# Patient Record
Sex: Female | Born: 1955 | Race: Black or African American | Hispanic: No | Marital: Married | Smoking: Never smoker
Health system: Southern US, Community
[De-identification: ages and names within clinical notes are randomized; demographics above are authoritative.]

---

## 2001-09-20 ENCOUNTER — Ambulatory Visit (HOSPITAL_COMMUNITY): Admission: RE | Admit: 2001-09-20 | Discharge: 2001-09-20 | Payer: Self-pay | Admitting: Gastroenterology

## 2001-09-28 ENCOUNTER — Encounter: Admission: RE | Admit: 2001-09-28 | Discharge: 2001-09-28 | Payer: Self-pay | Admitting: Gastroenterology

## 2001-09-28 ENCOUNTER — Encounter: Payer: Self-pay | Admitting: Gastroenterology

## 2004-08-31 ENCOUNTER — Ambulatory Visit (HOSPITAL_COMMUNITY): Admission: RE | Admit: 2004-08-31 | Discharge: 2004-08-31 | Payer: Self-pay | Admitting: Obstetrics

## 2012-12-08 ENCOUNTER — Other Ambulatory Visit (HOSPITAL_COMMUNITY): Payer: Self-pay | Admitting: Family Medicine

## 2012-12-08 DIAGNOSIS — Z1231 Encounter for screening mammogram for malignant neoplasm of breast: Secondary | ICD-10-CM

## 2012-12-20 ENCOUNTER — Ambulatory Visit (HOSPITAL_COMMUNITY)
Admission: RE | Admit: 2012-12-20 | Discharge: 2012-12-20 | Disposition: A | Discharge status: Home / Self Care | Source: Ambulatory Visit | Attending: Family Medicine | Admitting: Family Medicine

## 2012-12-20 DIAGNOSIS — Z1231 Encounter for screening mammogram for malignant neoplasm of breast: Secondary | ICD-10-CM

## 2012-12-28 ENCOUNTER — Other Ambulatory Visit: Payer: Self-pay | Admitting: Family Medicine

## 2012-12-28 DIAGNOSIS — R928 Other abnormal and inconclusive findings on diagnostic imaging of breast: Secondary | ICD-10-CM

## 2013-01-17 ENCOUNTER — Ambulatory Visit
Admission: RE | Admit: 2013-01-17 | Discharge: 2013-01-17 | Disposition: A | Discharge status: Home / Self Care | Source: Ambulatory Visit | Attending: Family Medicine | Admitting: Family Medicine

## 2013-01-17 DIAGNOSIS — R928 Other abnormal and inconclusive findings on diagnostic imaging of breast: Secondary | ICD-10-CM

## 2013-08-15 ENCOUNTER — Other Ambulatory Visit (HOSPITAL_COMMUNITY): Payer: Self-pay | Admitting: Family Medicine

## 2013-08-15 DIAGNOSIS — R109 Unspecified abdominal pain: Secondary | ICD-10-CM

## 2013-08-20 ENCOUNTER — Ambulatory Visit (HOSPITAL_COMMUNITY)
Admission: RE | Admit: 2013-08-20 | Discharge: 2013-08-20 | Disposition: A | Discharge status: Home / Self Care | Source: Ambulatory Visit | Attending: Family Medicine | Admitting: Family Medicine

## 2013-08-20 DIAGNOSIS — K7689 Other specified diseases of liver: Secondary | ICD-10-CM | POA: Insufficient documentation

## 2013-08-20 DIAGNOSIS — R109 Unspecified abdominal pain: Secondary | ICD-10-CM | POA: Insufficient documentation

## 2014-04-11 ENCOUNTER — Other Ambulatory Visit (HOSPITAL_COMMUNITY): Payer: Self-pay | Admitting: Family Medicine

## 2014-04-11 DIAGNOSIS — Z1231 Encounter for screening mammogram for malignant neoplasm of breast: Secondary | ICD-10-CM

## 2014-05-02 ENCOUNTER — Ambulatory Visit (HOSPITAL_COMMUNITY)

## 2014-05-08 ENCOUNTER — Ambulatory Visit (HOSPITAL_COMMUNITY)
Admission: RE | Admit: 2014-05-08 | Discharge: 2014-05-08 | Disposition: A | Discharge status: Home / Self Care | Source: Ambulatory Visit | Attending: Family Medicine | Admitting: Family Medicine

## 2014-05-08 DIAGNOSIS — Z1231 Encounter for screening mammogram for malignant neoplasm of breast: Secondary | ICD-10-CM

## 2016-07-13 IMAGING — MG MM DIGITAL SCREENING BILAT
6 series · 6 of 6 positions shown · non-contrast
Comparison: Previous exam(s).

CLINICAL DATA: Screening. Strong family history of breast cancer
diagnosed in sisters at ages 50 and 55.

EXAM:
DIGITAL SCREENING BILATERAL MAMMOGRAM WITH CAD

[L CC]
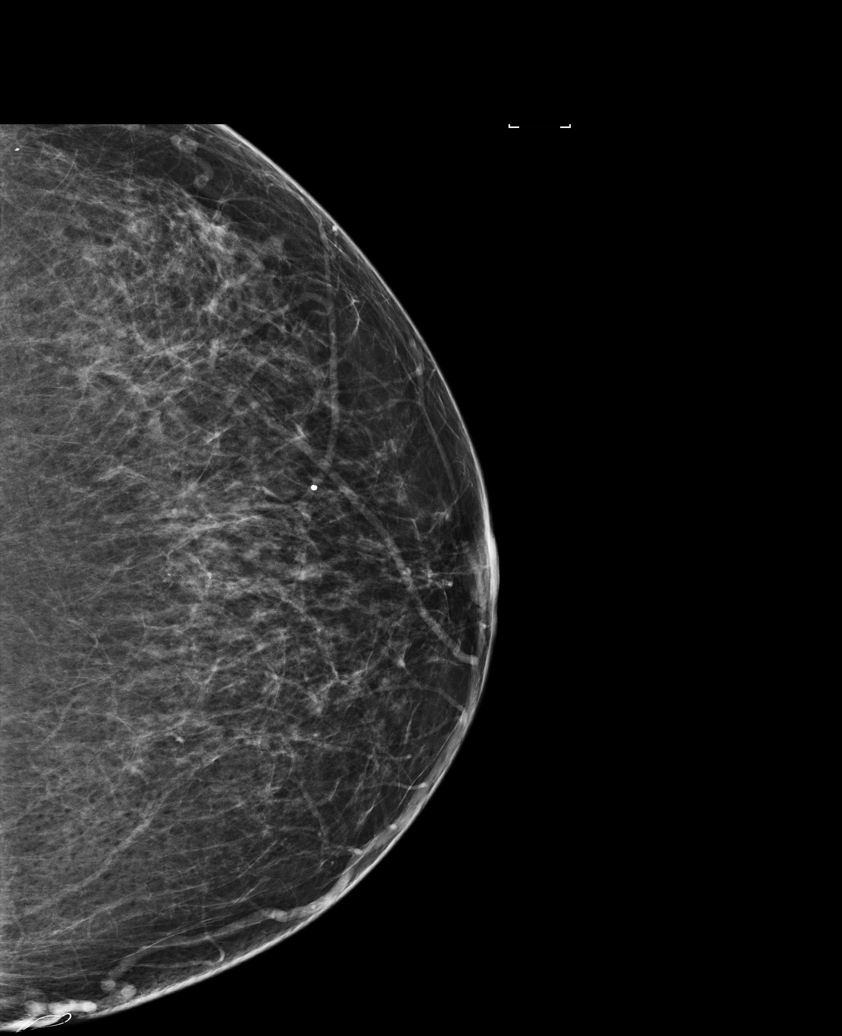

[R MLO (1 of 2)]
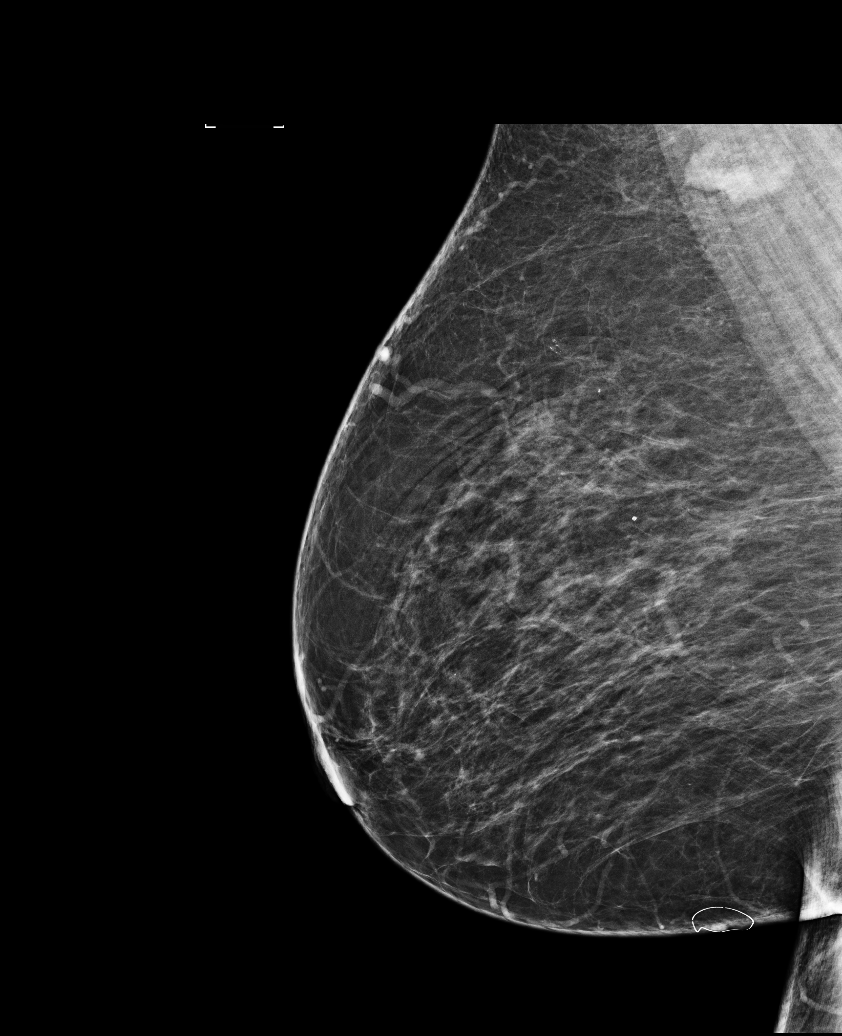

[R MLO (2 of 2)]
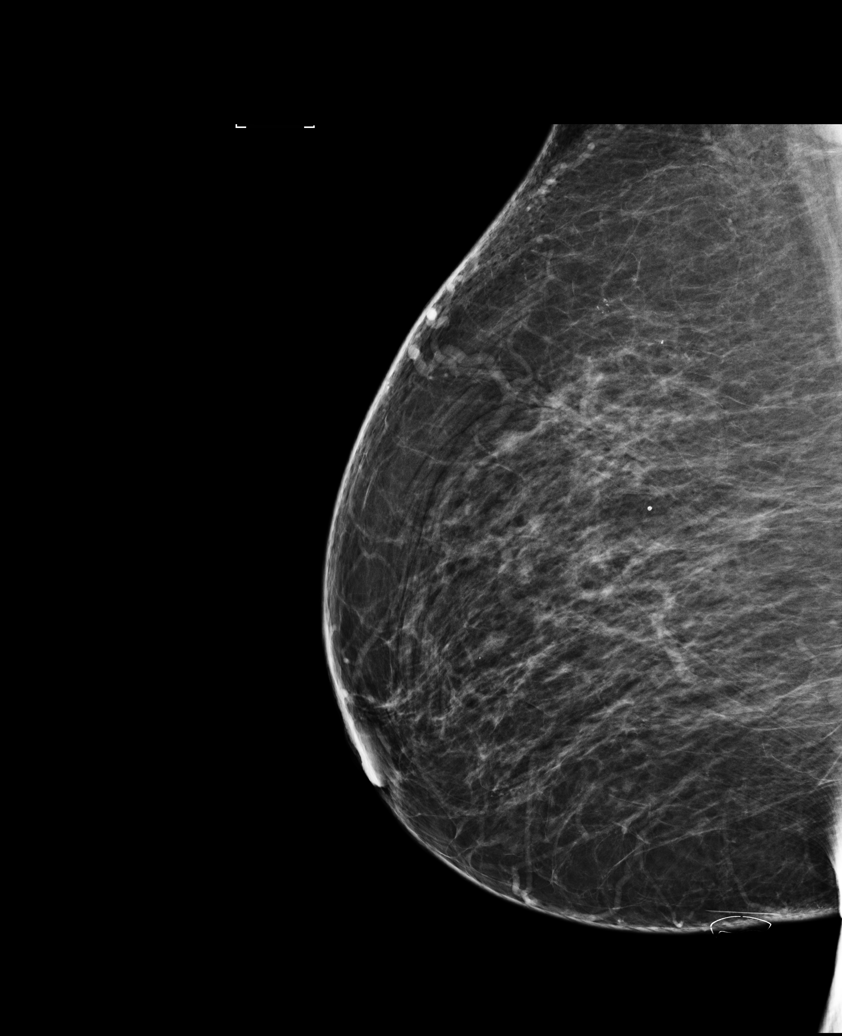

[R CC]
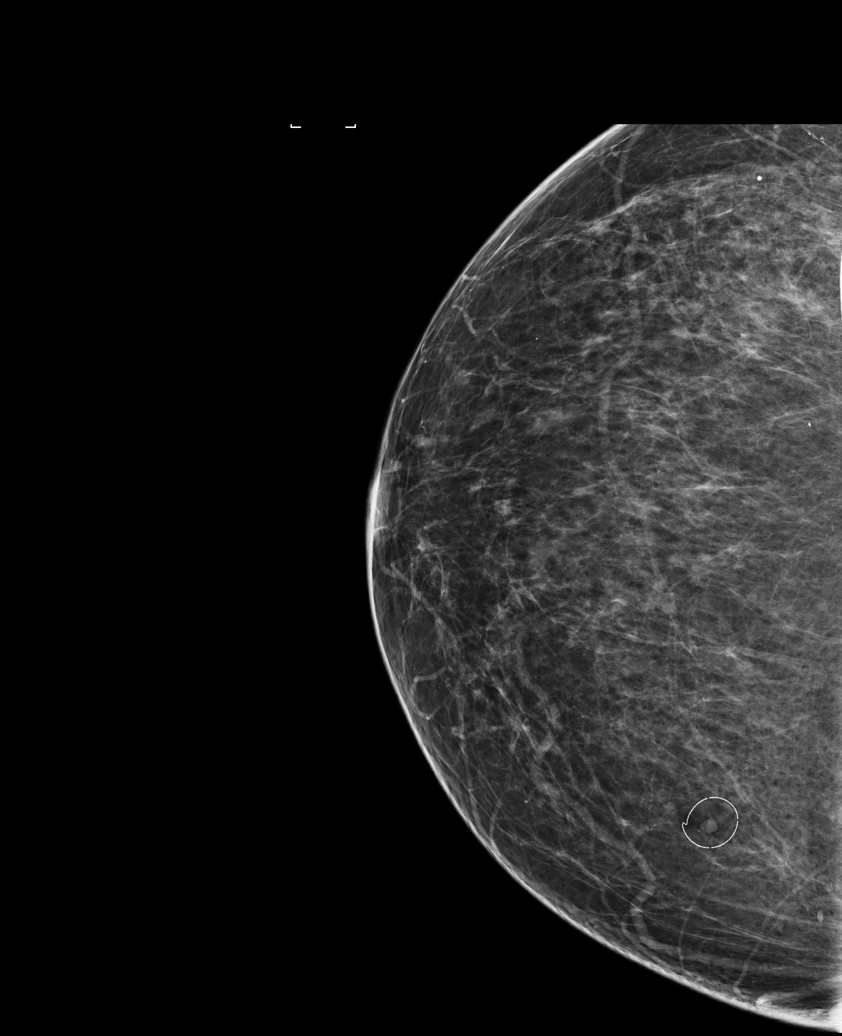

[L MLO (1 of 2)]
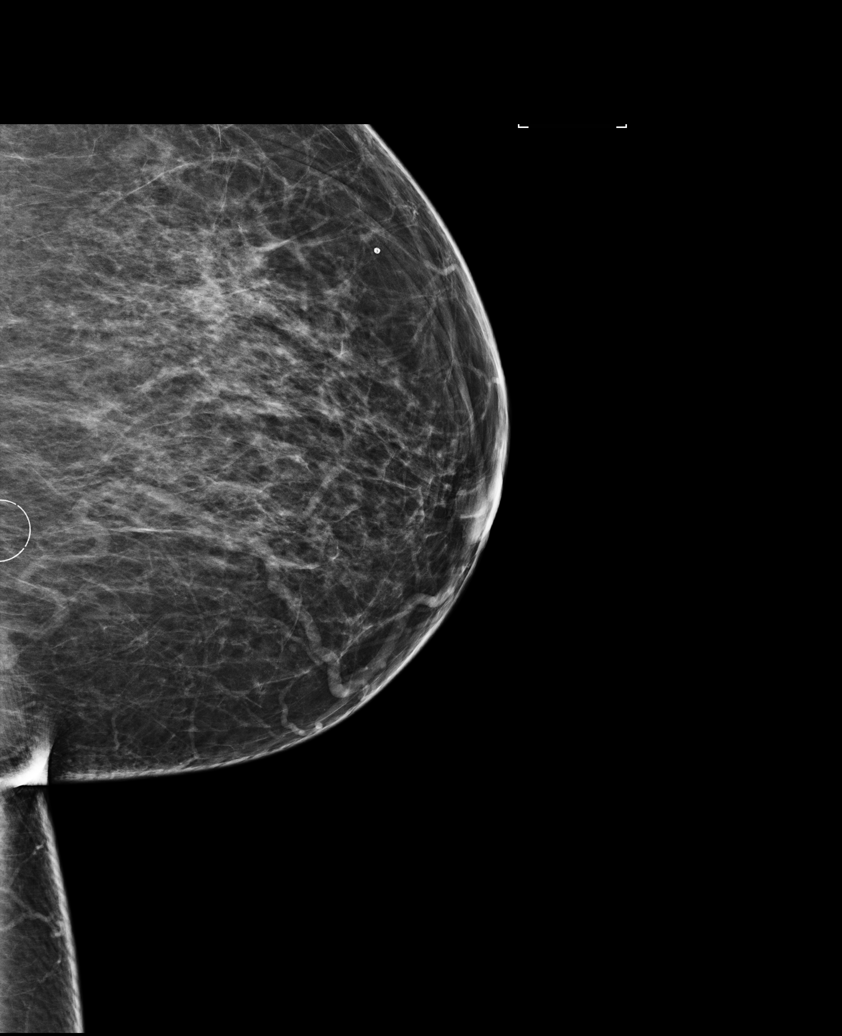

[L MLO (2 of 2)]
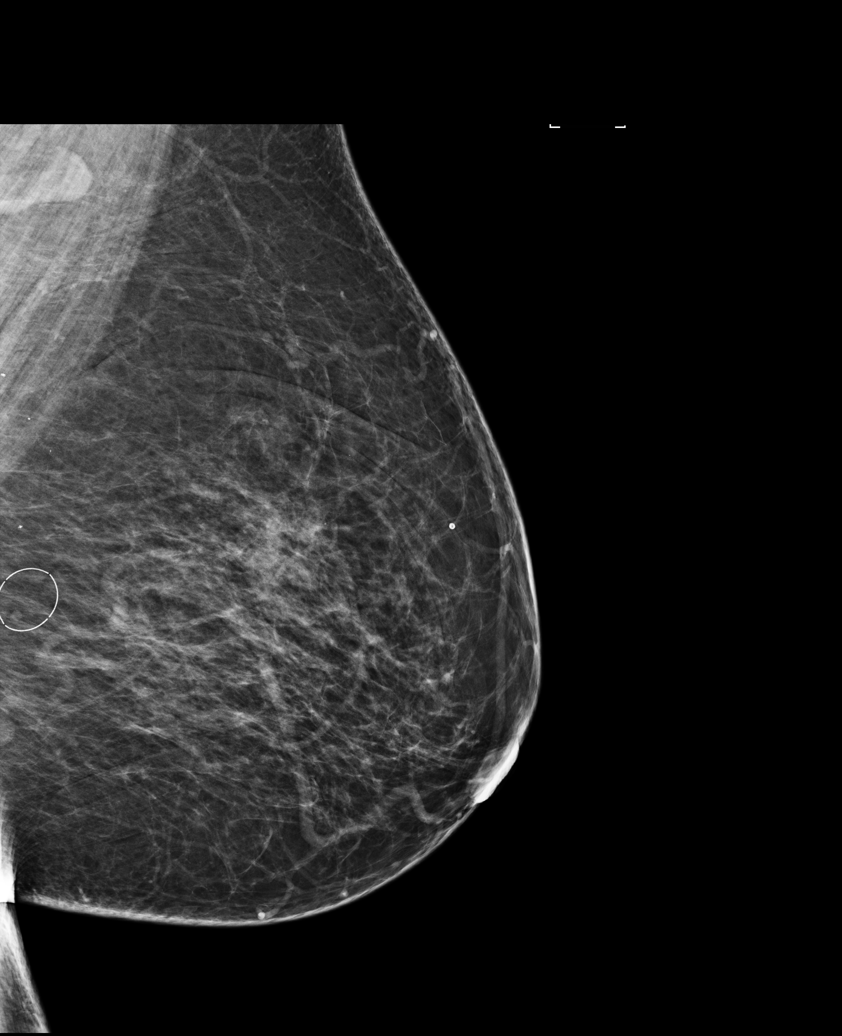

[6 of 6 positions shown; findings below may reference images not displayed]

ACR Breast Density Category b: There are scattered areas of
fibroglandular density.
FINDINGS: There are no findings suspicious for malignancy. Images were
processed with CAD.
IMPRESSION: 1. No mammographic evidence of malignancy.
2. Based on the Tyrer-Cuzick risk assessment model, the patient has
a 22% lifetime risk of developing breast cancer. By the
recommendations of the American Cancer Society, annual MRI and
annual mammography are recommended.
3. A result letter of this screening mammogram will be mailed
directly to the patient.

RECOMMENDATION:
1. Screening mammogram in one year. (Code:[id number])
2. Annual MRI is recommended in addition to mammography.

BI-RADS CATEGORY  1: Negative.

## 2022-06-23 ENCOUNTER — Encounter: Payer: Self-pay | Admitting: Nurse Practitioner

## 2022-06-23 ENCOUNTER — Ambulatory Visit (INDEPENDENT_AMBULATORY_CARE_PROVIDER_SITE_OTHER): Admitting: Nurse Practitioner

## 2022-06-23 VITALS — BP 126/76 | HR 66 | Ht 65.0 in | Wt 281.0 lb

## 2022-06-23 DIAGNOSIS — R142 Eructation: Secondary | ICD-10-CM | POA: Diagnosis not present

## 2022-06-23 DIAGNOSIS — R194 Change in bowel habit: Secondary | ICD-10-CM | POA: Diagnosis not present

## 2022-06-23 DIAGNOSIS — Z8619 Personal history of other infectious and parasitic diseases: Secondary | ICD-10-CM | POA: Diagnosis not present

## 2022-06-23 MED ORDER — BENEFIBER PO POWD: ORAL | 0 refills | Status: AC

## 2022-06-23 MED ORDER — RIFAXIMIN 550 MG PO TABS: 550.0000 mg | ORAL_TABLET | Freq: Three times a day (TID) | ORAL | 0 refills | Status: AC

## 2022-06-23 NOTE — Progress Notes (Addendum)
Chief Complaint:  diarrhea and constipation, sour belches   Assessment & Plan   # 66 yo female with "sour" belches and chronically altered bowel habits with loose stool alternating with " constipation".  Describes several days of loose stools followed by no BM for two days then passage of a hard stool. After hard BM the diarrhea returns and cycles repeats itself. The "constipation" phase is short lived and probably just due to absence of stool in colon after days of diarrhea. Etiology unclear but sounds like she may have diarrhea predominant IBS or SIBO possible. EPI less likely.  Previously evaluated at [facility] with reported negative EGD / colonoscopy.  Will request reports  Trial of Benefiber Treat empirically for SIBO. Will try Xifaxan. If not covered then will use a different regimen.  Follow up in 4 weeks. If not improving consider checking fecal elastase.   # History of H. Pylori in 2020 at [facility]. Reportedly treated but follow up testing no carried out.  Check for H.pylori eradication. Obtain H.pylori stool ag ( Diatherix so stopping PPI not needed).   # GERD?  Takes Omeprazole but she really doesn't know why. No GERD symptoms Might try changing to H2 blocker in future, or even try off reflux medications.   # Colon cancer screening.   Will review colonoscopy report when received from [place] and decide from there when her next screening colonoscopy needs to be done  # PAF on Eliquis  # Hypothyroidism   ADDENDUM 08/16/21 Records from [facility] Dr. Rolm Bookbinder 06/08/19  EGD and colonoscopy  for evaluation of weight loss, nausea and vomiting, diarrhea.  Normal esophagus.  GE junction and Z-line were at 40 cm.  Normal mucosa in the whole stomach.  Multiple biopsies taken.  Normal mucosa in the duodenum.  Multiple biopsies taken.  Colonoscopy-good prep.  Complete exam.  Normal mucosa in the whole colon.  Multiple biopsies taken to assess for microscopic  colitis.  Medium internal hemorrhoids.   HPI:    Destiny Miranda is a 66 y.o. year old female new to the practice with a past medical history of PVD, DM2, morbid obesity , H.pylori infection, hypothyroidism, PAF on Eliquis.  See PMH / [place] for additional history  Patient seen at [facility] in 2020.   RUQ Oct 2020 >> unrevealing except for stable presumed hemangioma in right hepatic lobe.   She  underwent EGD / colonoscopy in 2020. Reports cannot be seen in Care Everywhere but per follow up office note on 01/29/20 findings notable "HP and int hemorrhoids, but negative for celiac and microscopic colitis. Repeat colon recommended in 11/30". Recommendations were for  fiber supplement, check H.pylori stool ag ( not done),  check gastric emptying study ( not done).  She tried Metamucil but cannot really remember if it helped.   She continues to altered bowel habits, sour belches.  She may have 2-3 loose non-bloody BMs a day. Then no BM for a couple of days then has a hard stool requiring her to strain. The diarrhea will start back, sometimes in the same day. She cannot correlate bowel habits to anything she eats. Her weight is stable.    No [place] of colon cancer.   Previous GI Evaluation   EGD / colonoscopy 2020 at [facility]:  Gallbladder: No gallstones or wall thickening visualized. No sonographic Murphy sign noted by sonographer.   Common bile duct:  Diameter: 2 mm  Liver:  Focal, echogenic lesion in the right hepatic lobe measuring 11 x 10  x 11 mm is stable based on comparison with exam from 2015. No  additional lesion is visualized. Portal vein is patent on color  Doppler imaging with normal direction of blood flow towards the  liver.  Other: None.  IMPRESSION:  1. No signs of acute cholecystitis or biliary ductal dilation.  2. 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   Past Medical History:  Obesity, DM2, atrial fibrillation,  hypothyroidism, thyroid nodules, H. Pylori infection,   History reviewed. No pertinent surgical history. Family History  Problem Relation Age of Onset   Diabetes Father    Hypertension Father    Diabetes Sister    Hypertension Sister    Diabetes Brother    Hypertension Brother    Colon cancer Neg Hx    Stomach cancer Neg Hx    Esophageal cancer Neg Hx    Social History   Tobacco Use   Smoking status: Never   Smokeless tobacco: Never  Vaping Use   Vaping Use: Never used   Current Outpatient Medications  Medication Sig Dispense Refill   apixaban (ELIQUIS) 5 MG TABS tablet Take 5 mg by mouth 2 (two) times daily.     atorvastatin (LIPITOR) 80 MG tablet Take 80 mg by mouth daily.     furosemide (LASIX) 40 MG tablet Furosemide     gabapentin (NEURONTIN) 300 MG capsule Take by mouth.     insulin glargine (LANTUS) 100 UNIT/ML injection Lantus     lisinopril-hydrochlorothiazide (ZESTORETIC) 20-25 MG tablet Take 1 tablet by mouth daily.     NON FORMULARY Freestyle libre 14 day sensor kit     omeprazole (PRILOSEC) 20 MG capsule      pentoxifylline (TRENTAL) 400 MG CR tablet Take by mouth.     terbinafine (LAMISIL) 250 MG tablet      triamcinolone cream (KENALOG) 0.1 % Apply topically.     TRULICITY 3 EL/0.7AJ SOPN Inject into the skin.     pentoxifylline (TRENTAL) 400 MG CR tablet Pentoxifylline (Patient not taking: Reported on 06/23/2022)     No current facility-administered medications for this visit.   Not on File   Review of Systems: All other systems reviewed and negative except where noted in HPI.   Wt Readings from Last 3 Encounters:  06/23/22 281 lb (127.5 kg)    Physical Exam   BP 126/76   Pulse 66   Ht _0  (1.651 m)   Wt 281 lb (127.5 kg)   BMI 46.76 kg/m  Constitutional:  Pleasant obese female in no acute distress. Psychiatric: Pleasant. Normal mood and affect. Behavior is normal. EENT: Pupils normal.  Conjunctivae are normal. No scleral icterus. Neck  supple.  Cardiovascular: Normal rate, regular rhythm. No edema Pulmonary/chest: Effort normal and breath sounds normal. No wheezing, rales or rhonchi. Abdominal: Soft, nondistended, nontender. Bowel sounds active throughout. There are no masses palpable. No hepatomegaly. Neurological: Alert and oriented to person place and time. Skin: Skin is warm and dry. No rashes noted.  Tye Savoy, NP  06/23/2022, 11:37 AM  Cc:  Referring Provider Rupert Stacks, MD

## 2022-06-23 NOTE — Patient Instructions (Addendum)
Your provider has ordered "Diatherix" stool testing for you. You have received a kit from our office today containing all necessary supplies to complete this test. Please carefully read the stool collection instructions provided in the kit before opening the accompanying materials. In addition, be sure to place the label from the top left corner of the laboratory request sheet onto the "puritan opti-swab" tube that is supplied in the kit. This label should include your full name and date of birth. After completing the test, you should secure the purtian tube into the specimen biohazard bag. The laboratory request information sheet (including date and time of specimen collection) should be placed into the outside pocket of the specimen biohazard bag and returned to the [facility] lab with 2 days of collection.    We have sent the following medications to your pharmacy for you to pick up at your convenience: Benefiber and Xifaxan  I have made you a follow up appointment with Tye Savoy RNP on 12/13 1:30

## 2022-06-24 NOTE — Progress Notes (Signed)
Attending Physician's Attestation   I have reviewed the chart.   I agree with the Advanced Practitioner's note, impression, and recommendations with any updates as below.    Hermenegildo Clausen Mansouraty, MD [facility] Office # [phone]  

## 2022-06-28 ENCOUNTER — Other Ambulatory Visit (HOSPITAL_COMMUNITY): Payer: Self-pay

## 2022-07-02 ENCOUNTER — Telehealth: Payer: Self-pay | Admitting: Pharmacy Technician

## 2022-07-02 ENCOUNTER — Other Ambulatory Visit (HOSPITAL_COMMUNITY): Payer: Self-pay

## 2022-07-02 NOTE — Telephone Encounter (Signed)
Patient Advocate Encounter  Received notification from [employer] that prior authorization for XIFAXAN 550MG  is required.   PA submitted on 11.24.23 Key [id number] Status is pending

## 2022-07-05 NOTE — Telephone Encounter (Signed)
Received a fax regarding Prior Authorization from [employer] for [facility] 550MG . Authorization has been DENIED because:

## 2022-07-12 NOTE — Telephone Encounter (Signed)
Left message on machine to call back  

## 2022-07-14 NOTE — Telephone Encounter (Signed)
Left message on machine to call back  

## 2022-07-15 ENCOUNTER — Telehealth: Payer: Self-pay

## 2022-07-15 NOTE — Telephone Encounter (Signed)
Records received. Placed in your office IN box for review.

## 2022-07-15 NOTE — Telephone Encounter (Signed)
Called [facility] and spoke with Raynelle Fanning in Medical Records. She will fax over results to our office today. Will await fax.

## 2022-07-15 NOTE — Telephone Encounter (Signed)
-----   Message from Meredith Pel, NP sent at 07/09/2022  4:45 PM EST ----- Destiny Miranda, next week can you please try and get a copy of patient's EGD and colonoscopy with any biopsy reports from [facility]  Thanks

## 2022-07-16 NOTE — Telephone Encounter (Signed)
Unable to leave voicemail on patient's home phone number.

## 2022-07-20 NOTE — Telephone Encounter (Signed)
Left message for pt to call back. Letter mailed to patient.

## 2022-07-21 ENCOUNTER — Ambulatory Visit: Admitting: Nurse Practitioner

## 2022-07-27 ENCOUNTER — Other Ambulatory Visit: Payer: Self-pay

## 2022-07-27 MED ORDER — METRONIDAZOLE 250 MG PO TABS: 250.0000 mg | ORAL_TABLET | Freq: Three times a day (TID) | ORAL | 0 refills | Status: AC

## 2022-07-27 NOTE — Telephone Encounter (Signed)
Spoke with pt. Let pt know Destiny Miranda's recommendations. Pt verbalized understanding and confirmed she does not have an allergy to flagyl. Flagyl sent to pt's pharmacy. Pt had no other concerns at end of call.

## 2022-08-18 NOTE — Progress Notes (Signed)
08/19/2022 Destiny Miranda [id number] Sep 14, 1955  Referring provider: Christ Kick, * Primary GI doctor: Dr. Rush Landmark  ASSESSMENT AND PLAN:   Altered bowel habits with bloating/beltching Better with flagyl, finish RX, if reoccurs can repeat RX Can do trial of IBGARD daily, will give Bentyl as needed in the mean time  Add on citracel/benefiber FODMAP, trial off lactulose and lifestyle changes discussed  Type 2 diabetes mellitus with other specified complication, with long-term current use of insulin (HCC) Loose stools not associated with food Consider fecal elastase in the future On trulicity x 1 year, ? contributing  History of H pylori Negative diatherix   History of Present Illness:  67 y.o. female  with a past medical history of PVD, DM2 x 30 years, morbid obesity, H pylori infection, hyperthyroidism, PA on Elqiuis and others listed below, returns to clinic today for evaluation of abnormal stools and beltching. Diatherix negative for H. Pylori 06/23/2022 office visit with Tye Savoy for belching, altered bowel habits history of H. pylori.   Had unremarkable EGD colonoscopy at [facility], treated empirically for SIBO Xifaxan was not covered Flagyl 250 3 times daily for 10 days sent in.  She is on trulicity for her DM x 1 year, no worse symptoms with this, has had these symptoms for years.  She is still on the flagyl due to pharmacy mix up, she has been on it for 3-4 days and states she has noticed a difference.  She is not having diarrhea as much, 2 x a day, and having firmer stools.  Has lower AB cramping, better after BM.  Denies weight loss.  Does not correlate with foods. No nausea, vomiting. No melena, no hematochezia.  She denies lactulose, occ does but denies any worsening symptoms.  No family history of colon cancer.   06/08/19 EGD colon for evaluation of weight loss, nausea and vomiting, diarrhea.  Normal esophagus. GE junction and Z-line were at 40  cm. Normal mucosa in the whole stomach. Multiple biopsies taken. Normal mucosa in the duodenum. Multiple biopsies taken. Colonoscopy-good prep. Complete exam. Normal mucosa in the whole colon. Multiple biopsies taken to assess for microscopic colitis negative. Medium internal hemorrhoids.   She  reports that she has never smoked. She has never used smokeless tobacco. No history on file for alcohol use and drug use. Her family history includes Diabetes in her brother, father, and sister; Hypertension in her brother, father, and sister.   Current Medications:   Current Outpatient Medications (Endocrine & Metabolic):    insulin glargine (LANTUS) 100 UNIT/ML injection, Lantus   TRULICITY 3 LO/7.5IE SOPN, Inject into the skin.  Current Outpatient Medications (Cardiovascular):    atorvastatin (LIPITOR) 80 MG tablet, Take 80 mg by mouth daily.   furosemide (LASIX) 40 MG tablet, Furosemide   lisinopril-hydrochlorothiazide (ZESTORETIC) 20-25 MG tablet, Take 1 tablet by mouth daily.    Current Outpatient Medications (Hematological):    apixaban (ELIQUIS) 5 MG TABS tablet, Take 5 mg by mouth 2 (two) times daily.   pentoxifylline (TRENTAL) 400 MG CR tablet, Take by mouth. (Patient not taking: Reported on 08/19/2022)   pentoxifylline (TRENTAL) 400 MG CR tablet, Pentoxifylline (Patient not taking: Reported on 08/19/2022)  Current Outpatient Medications (Other):    dicyclomine (BENTYL) 20 MG tablet, Take 1 tablet (20 mg total) by mouth 3 (three) times daily as needed for spasms.   gabapentin (NEURONTIN) 300 MG capsule, Take by mouth.   metroNIDAZOLE (FLAGYL) 250 MG tablet, Take 1 tablet (250 mg total)  by mouth 3 (three) times daily.   NON FORMULARY, Freestyle libre 14 day sensor kit   terbinafine (LAMISIL) 250 MG tablet,    Wheat Dextrin (BENEFIBER) POWD, Take one tbsp daily in 8 oz of water   omeprazole (PRILOSEC) 20 MG capsule,    triamcinolone cream (KENALOG) 0.1 %, Apply topically. (Patient not  taking: Reported on 08/19/2022)  Surgical History:  She  has no past surgical history on file.  Current Medications, Allergies, Past Medical History, Past Surgical History, Family History and Social History were reviewed in [employer] record.  Physical Exam: BP 138/72   Pulse 65   Ht 5\' 9"  (1.753 m)   Wt 279 lb 8 oz (126.8 kg)   BMI 41.27 kg/m  General:   Pleasant, well developed female in no acute distress Heart : Regular rate and rhythm; no murmurs Pulm: Clear anteriorly; no wheezing Abdomen:  Soft, Obese AB, Active bowel sounds. No tenderness . Without guarding and Without rebound, No organomegaly appreciated. Rectal: Not evaluated Extremities:  without  edema. Neurologic:  Alert and  oriented x4;  No focal deficits.  Psych:  Cooperative. Normal mood and affect.   Vladimir Crofts, PA-C 08/19/22

## 2022-08-19 ENCOUNTER — Ambulatory Visit (INDEPENDENT_AMBULATORY_CARE_PROVIDER_SITE_OTHER): Payer: Medicare Other | Admitting: Physician Assistant

## 2022-08-19 ENCOUNTER — Encounter: Payer: Self-pay | Admitting: Physician Assistant

## 2022-08-19 VITALS — BP 138/72 | HR 65 | Ht 69.0 in | Wt 279.5 lb

## 2022-08-19 DIAGNOSIS — R194 Change in bowel habit: Secondary | ICD-10-CM

## 2022-08-19 DIAGNOSIS — R142 Eructation: Secondary | ICD-10-CM | POA: Diagnosis not present

## 2022-08-19 DIAGNOSIS — Z794 Long term (current) use of insulin: Secondary | ICD-10-CM | POA: Diagnosis not present

## 2022-08-19 DIAGNOSIS — E1169 Type 2 diabetes mellitus with other specified complication: Secondary | ICD-10-CM | POA: Diagnosis not present

## 2022-08-19 MED ORDER — DICYCLOMINE HCL 20 MG PO TABS: 20.0000 mg | ORAL_TABLET | Freq: Three times a day (TID) | ORAL | 0 refills | Status: AC | PRN

## 2022-08-19 NOTE — Progress Notes (Signed)
Attending Physician's Attestation   I have reviewed the chart.   I agree with the Advanced Practitioner's note, impression, and recommendations with any updates as below.    Soua Lenk Mansouraty, MD [facility] Office # [phone]  

## 2022-08-19 NOTE — Patient Instructions (Addendum)
Please follow up in 4 months. Give [place] a call at [phone] to schedule an appointment.   We have sent the following medications to your pharmacy for you to pick up at your convenience: [place].   Add fiber like benefiber or citracel once a day Increase activity Can do trial of IBGard which is over the counter for AB pain- Take 1-2 capsules once a day for maintence or twice a day during a flare Can send in an anti spasm medication, Bentyl, to take as needed    Please try low FODMAP diet- see below- start with eliminating just one column at a time, the table at the very bottom contains foods that are safe to take   FODMAP stands for fermentable oligo-, di-, mono-saccharides and polyols (1). These are the scientific terms used to classify groups of carbs that are notorious for triggering digestive symptoms like bloating, gas and stomach pain.

## 2022-10-05 ENCOUNTER — Encounter: Payer: Self-pay | Admitting: Physician Assistant

## 2022-10-11 ENCOUNTER — Encounter: Payer: Self-pay | Admitting: Physician Assistant

## 2022-12-23 NOTE — Progress Notes (Addendum)
12/27/2022 Destiny Miranda [id number] July 23, 1956  Referring provider: Anselmo Pickler, * Primary GI doctor: Dr.  Meridee Score   ASSESSMENT AND PLAN:   Altered bowel habits with bloating/beltching Better with flagyl and change in lifestyle changes Continue benefiber and probiotic.  If symptoms reoccur, can repeat flagyl  Type 2 diabetes mellitus with other specified complication, with long-term current use of insulin (HCC) Continue trulicity Consider pancreatic fecal elastase if symptoms continue  History of H pylori Negative diatherix  Recall colon 2030, normal colonoscopy at [facility] 2020 unless she has bleeding, or other symptoms in the mean time.   History of Present Illness:  67 y.o. female  with a past medical history of PVD, DM2 x 30 years, morbid obesity, H pylori infection, hyperthyroidism, PA on Elqiuis and others listed below, returns to clinic today for evaluation of abnormal stools and beltching.  Diatherix negative for H. Pylori 06/23/2022 office visit with Willette Cluster for belching, altered bowel habits history of H. pylori.   Had unremarkable EGD colonoscopy at [facility], treated empirically for SIBO Xifaxan was not covered Flagyl 250 3 times daily for 10 days sent in. Patient had some improvement of symptoms, will repeat prescription if symptoms recur. IBgard, Bentyl sent in.  FODMAP discussed.  She states very rare burping.beltching.  No GERD, no dysphagia.  She has BM every day, occ diarrhea but otherwise more formed stools.  She denies AB pain, no weight loss.   No nausea, vomiting. No melena, no hematochezia.   She is on benefiber, probiotics and feels this is helping.  No family history of colon cancer.   06/08/19 EGD colon for evaluation of weight loss, nausea and vomiting, diarrhea.  Normal esophagus. GE junction and Z-line were at 40 cm. Normal mucosa in the whole stomach. Multiple biopsies taken. Normal mucosa in the duodenum Colonoscopy-good  prep. Complete exam. Normal mucosa in the whole colon. Multiple biopsies taken to assess for microscopic colitis negative. Medium internal hemorrhoids.   She  reports that she has never smoked. She has never used smokeless tobacco. No history on file for alcohol use and drug use. Her family history includes Diabetes in her brother, father, and sister; Hypertension in her brother, father, and sister.   Current Medications:   Current Outpatient Medications (Endocrine & Metabolic):    insulin glargine (LANTUS) 100 UNIT/ML injection, Lantus   TRULICITY 3 MG/0.5ML SOPN, Inject into the skin. (Patient not taking: Reported on 12/27/2022)  Current Outpatient Medications (Cardiovascular):    atorvastatin (LIPITOR) 80 MG tablet, Take 80 mg by mouth daily.   furosemide (LASIX) 40 MG tablet, Furosemide   lisinopril-hydrochlorothiazide (ZESTORETIC) 20-25 MG tablet, Take 1 tablet by mouth daily.    Current Outpatient Medications (Hematological):    apixaban (ELIQUIS) 5 MG TABS tablet, Take 5 mg by mouth 2 (two) times daily.   pentoxifylline (TRENTAL) 400 MG CR tablet, Take by mouth. (Patient not taking: Reported on 12/27/2022)   pentoxifylline (TRENTAL) 400 MG CR tablet, Pentoxifylline (Patient not taking: Reported on 12/27/2022)  Current Outpatient Medications (Other):    dicyclomine (BENTYL) 20 MG tablet, Take 1 tablet (20 mg total) by mouth 3 (three) times daily as needed for spasms. (Patient not taking: Reported on 12/27/2022)   gabapentin (NEURONTIN) 300 MG capsule, Take by mouth. (Patient not taking: Reported on 12/27/2022)   metroNIDAZOLE (FLAGYL) 250 MG tablet, Take 1 tablet (250 mg total) by mouth 3 (three) times daily. (Patient not taking: Reported on 12/27/2022)   NON FORMULARY, Freestyle libre 14  day sensor kit (Patient not taking: Reported on 12/27/2022)   omeprazole (PRILOSEC) 20 MG capsule,    terbinafine (LAMISIL) 250 MG tablet,    triamcinolone cream (KENALOG) 0.1 %, Apply topically.  (Patient not taking: Reported on 12/27/2022)   Wheat Dextrin (BENEFIBER) POWD, Take one tbsp daily in 8 oz of water (Patient not taking: Reported on 12/27/2022)  Surgical History:  She  has no past surgical history on file.  Current Medications, Allergies, Past Medical History, Past Surgical History, Family History and Social History were reviewed in [employer] record.  Physical Exam: BP 130/68   Pulse 71   Ht 5\' 5"  (1.651 m)   Wt 279 lb (126.6 kg)   BMI 46.43 kg/m  General:   Pleasant, well developed female in no acute distress Heart : Regular rate and rhythm; no murmurs Pulm: Clear anteriorly; no wheezing Abdomen:  Soft, Obese AB, Active bowel sounds. No tenderness . Without guarding and Without rebound, No organomegaly appreciated. Rectal: Not evaluated Extremities:  without  edema. Neurologic:  Alert and  oriented x4;  No focal deficits.  Psych:  Cooperative. Normal mood and affect.   Doree Albee, PA-C 12/27/22

## 2022-12-27 ENCOUNTER — Encounter: Payer: Self-pay | Admitting: Physician Assistant

## 2022-12-27 ENCOUNTER — Ambulatory Visit (INDEPENDENT_AMBULATORY_CARE_PROVIDER_SITE_OTHER): Payer: Medicare Other | Admitting: Physician Assistant

## 2022-12-27 VITALS — BP 130/68 | HR 71 | Ht 65.0 in | Wt 279.0 lb

## 2022-12-27 DIAGNOSIS — R142 Eructation: Secondary | ICD-10-CM

## 2022-12-27 DIAGNOSIS — E1169 Type 2 diabetes mellitus with other specified complication: Secondary | ICD-10-CM | POA: Diagnosis not present

## 2022-12-27 DIAGNOSIS — Z8619 Personal history of other infectious and parasitic diseases: Secondary | ICD-10-CM

## 2022-12-27 DIAGNOSIS — R194 Change in bowel habit: Secondary | ICD-10-CM

## 2022-12-27 DIAGNOSIS — Z794 Long term (current) use of insulin: Secondary | ICD-10-CM

## 2022-12-27 NOTE — Patient Instructions (Addendum)
We may want to evaluate you for small intestinal bacterial overgrowth, this can cause increase gas, bloating, loose stools or constipation.  There is a test for this we can do or sometimes we will treat a patient with an antibiotic to see if it helps.   Please call us to schedule for a 6 month follow up with Quentin Mulling.  _______________________________________________________  If your blood pressure at your visit was 140/90 or greater, please contact your primary care physician to follow up on this.  _______________________________________________________  If you are age 65 or older, your body mass index should be between 23-30. Your Body mass index is 46.43 kg/m. If this is out of the aforementioned range listed, please consider follow up with your Primary Care Provider.  If you are age 46 or younger, your body mass index should be between 19-25. Your Body mass index is 46.43 kg/m. If this is out of the aformentioned range listed, please consider follow up with your Primary Care Provider.   ________________________________________________________  The [place] [facility] providers would like to encourage you to use [facility] to communicate with providers for non-urgent requests or questions.  Due to long hold times on the telephone, sending your provider a message by [facility] may be a faster and more efficient way to get a response.  Please allow 48 business hours for a response.  Please remember that this is for non-urgent requests.  _______________________________________________________ It was a pleasure to see you today!  Thank you for trusting me with your gastrointestinal care!

## 2023-01-10 NOTE — Progress Notes (Signed)
Attending Physician's Attestation   I have reviewed the chart.   I agree with the Advanced Practitioner's note, impression, and recommendations with any updates as below.    Jeremey Bascom Mansouraty, MD [facility] Office # [phone]
